# Patient Record
Sex: Male | Born: 2000 | Race: Black or African American | Hispanic: No | Marital: Single | Smoking: Never smoker
Health system: Southern US, Community
[De-identification: ages and names within clinical notes are randomized; demographics above are authoritative.]

## PROBLEM LIST (undated history)

## (undated) DIAGNOSIS — R55 Syncope and collapse: Secondary | ICD-10-CM

## (undated) DIAGNOSIS — I951 Orthostatic hypotension: Secondary | ICD-10-CM

## (undated) DIAGNOSIS — R002 Palpitations: Secondary | ICD-10-CM

## (undated) HISTORY — DX: Syncope and collapse: R55

## (undated) HISTORY — DX: Orthostatic hypotension: I95.1

## (undated) HISTORY — DX: Palpitations: R00.2

## (undated) HISTORY — PX: NO PAST SURGERIES: SHX2092

---
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
## 2018-01-08 DIAGNOSIS — Z8679 Personal history of other diseases of the circulatory system: Secondary | ICD-10-CM | POA: Insufficient documentation

## 2018-01-08 DIAGNOSIS — R002 Palpitations: Secondary | ICD-10-CM | POA: Insufficient documentation

## 2018-01-12 ENCOUNTER — Encounter: Payer: Self-pay | Admitting: Internal Medicine

## 2018-01-12 ENCOUNTER — Ambulatory Visit (INDEPENDENT_AMBULATORY_CARE_PROVIDER_SITE_OTHER): Payer: 59 | Admitting: Internal Medicine

## 2018-01-12 DIAGNOSIS — Z8679 Personal history of other diseases of the circulatory system: Secondary | ICD-10-CM

## 2018-01-12 DIAGNOSIS — R002 Palpitations: Secondary | ICD-10-CM

## 2018-01-12 NOTE — Patient Instructions (Signed)
Medication Instructions:  Your physician recommends that you continue on your current medications as directed. Please refer to the Current Medication list given to you today.  Labwork: None ordered.  Testing/Procedures: Zio Event Monitor [place] in our [place] location.  Follow-Up: Your physician recommends that you schedule a follow-up appointment in: 4 weeks after your patch is placed with Dr Graciela [facility]Klein [facility]   Any Other Special Instructions Will Be Listed Below (If Applicable).     If you need a refill on your cardiac medications before your next appointment, please call your pharmacy.

## 2018-01-12 NOTE — Progress Notes (Signed)
ELECTROPHYSIOLOGY OFFICE  NOTE  Patient ID: Vincent Cortez, MRN: [id number], DOB/AGE: 19/03/2001 16 y.o. Admit date: (Not on file) Date of Consult: 01/12/2018  Primary Physician: Vincent Cortez, [name], MD Primary Cardiologist: Vincent Cortez     Vincent Heide GuileLaurence Cortez is a 17 y.o. male who is being seen today for the evaluation of palpitations at his own request Self-referred because of tachypalpitations.  He has had these over the years.  I reviewed notes from 2015 from pediatric cardiology.  They were felt to be not significant.  A monitor was recommended and declined.  He is continued to have episodes.  They occur a couple times a week.  They are abrupt in onset and offset last 5-10 seconds.  An episode a few weeks ago occurred while he was urinating at night and was associated with syncope/near syncope.  He has a history of post exercise lightheadedness.  He is prone to dehydration particularly notable in the summer again always with post exertion.  He has had no exertional lightheadedness.  He has no exercise intolerance.    Past Medical History:  Diagnosis Date  . Orthostatic hypotension   . Palpitations   . Syncope    Surgical History:  Past Surgical History:  Procedure Laterality Date  . NO PAST SURGERIES       Home Meds: Prior to Admission medications   Not on File    Allergies: No Known Allergies  Social History   Socioeconomic History  . Marital status: Single    Spouse name: Not on file  . Number of children: Not on file  . Years of education: Not on file  . Highest education level: Not on file  Occupational History  . Not on file  Social Needs  . Financial resource strain: Not on file  . Food insecurity:    Worry: Not on file    Inability: Not on file  . Transportation needs:    Medical: Not on file    Non-medical: Not on file  Tobacco Use  . Smoking status: Never Smoker  . Smokeless tobacco: Never Used  Substance and Sexual Activity  . Alcohol  use: No  . Drug use: Never  . Sexual activity: Not on file  Lifestyle  . Physical activity:    Days per week: Not on file    Minutes per session: Not on file  . Stress: Not on file  Relationships  . Social connections:    Talks on phone: Not on file    Gets together: Not on file    Attends religious service: Not on file    Active member of club or organization: Not on file    Attends meetings of clubs or organizations: Not on file    Relationship status: Not on file  . Intimate partner violence:    Fear of current or ex partner: Not on file    Emotionally abused: Not on file    Physically abused: Not on file    Forced sexual activity: Not on file  Other Topics Concern  . Not on file  Social History Narrative  . Not on file     Family History  Problem Relation Age of Onset  . Cancer Paternal Grandmother   . Hypertension Paternal Grandfather      ROS:  Please see the history of present illness.     All other systems reviewed and negative.    Physical Exam: Blood pressure 120/76, pulse 70, height 6\' 3"  (1.905 m),  weight 203 lb (92.1 kg), SpO2 99 %. General: Well developed, well nourished male in no acute distress. Head: Normocephalic, atraumatic, sclera non-icteric, no xanthomas, nares are without discharge. EENT: normal  Lymph Nodes:  none Neck: Negative for carotid bruits. JVD not elevated. Back:without scoliosis kyphosis  Lungs: Clear bilaterally to auscultation without wheezes, rales, or rhonchi. Breathing is unlabored. Heart: RRR with S1 S2. N murmur . No rubs, or gallops appreciated. Abdomen: Soft, non-tender, non-distended with normoactive bowel sounds. No hepatomegaly. No rebound/guarding. No obvious abdominal masses. Msk:  Strength and tone appear normal for age. Extremities: No clubbing or cyanosis.  edema.  Distal pedal pulses are 2+ and equal bilaterally. Skin: Warm and Dry Neuro: Alert and oriented X 3. CN III-XII intact Grossly normal sensory and motor  function . Psych:  Responds to questions appropriately with a normal affect.      Labs: Cardiac Enzymes No results for input(s): CKTOTAL, CKMB, TROPONINI in the last 72 hours. CBC No results found for: WBC, HGB, HCT, MCV, PLT PROTIME: No results for input(s): LABPROT, INR in the last 72 hours. Chemistry No results for input(s): NA, K, CL, CO2, BUN, CREATININE, CALCIUM, PROT, BILITOT, ALKPHOS, ALT, AST, GLUCOSE in the last 168 hours.  Invalid input(s): LABALBU Lipids No results found for: CHOL, HDL, LDLCALC, TRIG BNP No results found for: PROBNP Thyroid Function Tests: No results for input(s): TSH, T4TOTAL, T3FREE, THYROIDAB in the last 72 hours.  Invalid input(s): FREET3 Miscellaneous No results found for: DDIMER  Radiology/Studies:  No results found.  EKG: Sinus rhythm at 60 Intervals 18/09/38 Axis 64   Assessment and Plan:  Palpitations abrupt in onset  Post exertional lightheadedness  Presyncope    We have reviewed the mechanisms of volume dependent lightheadedness and its relationship to post exertional lightheadedness.  He has no symptoms to suggest more malignant lightheadedness.  Stressed the importance of salt and water repletion.  His palpitations are interesting in that they are abrupt in onset but rather brief.  I am not sure whether they represent an atrial tachycardia or short bursts of reentrant tachycardia.  Will use a ZIO Patch.      Sherryl Manges

## 2018-01-14 ENCOUNTER — Ambulatory Visit: Payer: 59

## 2018-01-14 DIAGNOSIS — R002 Palpitations: Secondary | ICD-10-CM

## 2018-01-14 DIAGNOSIS — Z8679 Personal history of other diseases of the circulatory system: Secondary | ICD-10-CM

## 2018-01-15 ENCOUNTER — Ambulatory Visit: Payer: PRIVATE HEALTH INSURANCE | Admitting: Internal Medicine

## 2018-01-19 NOTE — Addendum Note (Signed)
Addended by: Dareen PianoKENAN, Samaiyah Howes C on: 01/19/2018 07:40 AM   Modules accepted: Orders

## 2018-01-23 ENCOUNTER — Telehealth: Payer: Self-pay

## 2018-01-23 NOTE — Telephone Encounter (Signed)
-----   Message from Stann MainlandJennifer O Clark, RN sent at 01/23/2018  8:21 AM EDT ----- Regarding: FW: 4 week f/u [facility] GoesHey Ladies,  Can you call patient to set up appointment with Dr Graciela HusbandsKlein as requested below?  Thanks, Victorino DikeJennifer  ----- Message ----- From: Rebbeca PaulKenan, Detavia, CMA Sent: 01/22/2018   8:51 AM To: Jefferey PicaHeather C McGhee, RN Subject: 4 week f/u                                     Hey :)  This patient was seen in [place] by SK and was ordered a ZIO patch that was placed on 6/5. SK wanted a f/u 4 weeks post monitor for him in [place]. Can you arrange for me please?  Thanks

## 2018-01-23 NOTE — Telephone Encounter (Signed)
Left msg for pt to call and schedule 4 wk f/u with Dr. Graciela HusbandsKlein

## 2018-01-29 NOTE — Telephone Encounter (Signed)
Lmov for patient to call and schedule °

## 2018-02-04 NOTE — Telephone Encounter (Signed)
Pt is scheduled 02/26/18

## 2018-02-26 ENCOUNTER — Ambulatory Visit: Payer: 59 | Admitting: Internal Medicine

## 2018-03-05 ENCOUNTER — Ambulatory Visit (INDEPENDENT_AMBULATORY_CARE_PROVIDER_SITE_OTHER): Payer: 59 | Admitting: Internal Medicine

## 2018-03-05 ENCOUNTER — Encounter: Payer: Self-pay | Admitting: Internal Medicine

## 2018-03-05 VITALS — BP 124/82 | HR 64 | Ht 75.0 in | Wt 197.0 lb

## 2018-03-05 DIAGNOSIS — R002 Palpitations: Secondary | ICD-10-CM | POA: Diagnosis not present

## 2018-03-05 NOTE — Patient Instructions (Signed)
Medication Instructions: - no changes  Labwork: - none ordered  Procedures/Testing: - none ordered  Follow-Up: - as needed with Dr. Graciela HusbandsKlein.  Any Additional Special Instructions Will Be Listed Below (If Applicable).     If you need a refill on your cardiac medications before your next appointment, please call your pharmacy.

## 2018-03-05 NOTE — Progress Notes (Signed)
ELECTROPHYSIOLOGY OFFICE  NOTE  Patient ID: Vincent Cortez, MRN: [id number], DOB/AGE: 2001/04/30 16 y.o. Admit date: (Not on file) Date of Consult: 03/05/2018  Primary Physician: Herb Grays, MD Primary Cardiologist: Vincent Cortez is a 17 y.o. male seen in followup for Self-referral because of tachypalpitations.  He has had these over the years.  I reviewed notes from 2015 from pediatric cardiology.  They were felt to be not significant.  A monitor was recommended and declined.  An episode a few weeks ago occurred while he was urinating at night and was associated with syncope/near syncope.  A ZIIO patch was worn; it lasted 4 days.  Multiple events were identified.  They were typical.  Frequently nocturnal.    Past Medical History:  Diagnosis Date  . Orthostatic hypotension   . Palpitations   . Syncope    Surgical History:  Past Surgical History:  Procedure Laterality Date  . NO PAST SURGERIES       Home Meds: Prior to Admission medications   Not on File    Allergies: No Known Allergies  Social History   Socioeconomic History  . Marital status: Single    Spouse name: Not on file  . Number of children: Not on file  . Years of education: Not on file  . Highest education level: Not on file  Occupational History  . Not on file  Social Needs  . Financial resource strain: Not on file  . Food insecurity:    Worry: Not on file    Inability: Not on file  . Transportation needs:    Medical: Not on file    Non-medical: Not on file  Tobacco Use  . Smoking status: Never Smoker  . Smokeless tobacco: Never Used  Substance and Sexual Activity  . Alcohol use: No  . Drug use: Never  . Sexual activity: Not on file  Lifestyle  . Physical activity:    Days per week: Not on file    Minutes per session: Not on file  . Stress: Not on file  Relationships  . Social connections:    Talks on phone: Not on file    Gets together: Not on  file    Attends religious service: Not on file    Active member of club or organization: Not on file    Attends meetings of clubs or organizations: Not on file    Relationship status: Not on file  . Intimate partner violence:    Fear of current or ex partner: Not on file    Emotionally abused: Not on file    Physically abused: Not on file    Forced sexual activity: Not on file  Other Topics Concern  . Not on file  Social History Narrative  . Not on file     Family History  Problem Relation Age of Onset  . Cancer Paternal Grandmother   . Hypertension Paternal Grandfather      ROS:  Please see the history of present illness.     All other systems reviewed and negative.    Physical Exam: Blood pressure 124/82, pulse 64, height 6\' 3"  (1.905 m), weight 197 lb (89.4 kg). Well developed and nourished in no acute distress HENT normal Neck supple  t Clear Regular rate and rhythm, no murmurs or gallops Abd-soft   No Clubbing cyanosis edema Skin-warm and dry A & Oriented  Grossly normal sensory and motor function  Labs: Cardiac Enzymes No results for input(s): CKTOTAL, CKMB, TROPONINI in the last 72 hours. CBC No results found for: WBC, HGB, HCT, MCV, PLT PROTIME: No results for input(s): LABPROT, INR in the last 72 hours. Chemistry No results for input(s): NA, K, CL, CO2, BUN, CREATININE, CALCIUM, PROT, BILITOT, ALKPHOS, ALT, AST, GLUCOSE in the last 168 hours.  Invalid input(s): LABALBU Lipids No results found for: CHOL, HDL, LDLCALC, TRIG BNP No results found for: PROBNP Thyroid Function Tests: No results for input(s): TSH, T4TOTAL, T3FREE, THYROIDAB in the last 72 hours.  Invalid input(s): FREET3 Miscellaneous No results found for: DDIMER  Radiology/Studies:  No results found.  ZIO patch was evaluated with the patient.  He had nocturnal Mobitz 1 heart block.  He had symptoms associated with isolated ectopic beats and symptoms associated with a transient  bradycardia mechanism not clear.  Assessment and Plan:  Palpitations abrupt in onset  Post exertional lightheadedness  Presyncope  We again have reviewed the importance of volume resuscitation.  We reviewed also the data from his CO monitor.  No significant arrhythmias were identified.    Sherryl MangesSteven Anquan Azzarello

## 2019-09-16 ENCOUNTER — Ambulatory Visit
Admission: EM | Admit: 2019-09-16 | Discharge: 2019-09-16 | Disposition: A | Discharge status: Home / Self Care | Payer: BLUE CROSS/BLUE SHIELD | Attending: Family Medicine | Admitting: Family Medicine

## 2019-09-16 ENCOUNTER — Other Ambulatory Visit: Payer: Self-pay

## 2019-09-16 DIAGNOSIS — R21 Rash and other nonspecific skin eruption: Secondary | ICD-10-CM

## 2019-09-16 MED ORDER — TERBINAFINE HCL 250 MG PO TABS: 250.0000 mg | ORAL_TABLET | Freq: Every day | ORAL | 0 refills | Status: AC

## 2019-09-16 NOTE — ED Triage Notes (Signed)
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 States that he noticed it a few days ago with mild itching.

## 2019-09-16 NOTE — Discharge Instructions (Signed)
Medication as prescribed.  See Dermatology it persists (Call [facility] for an appt).  Take care  Dr. Adriana Simas

## 2019-09-16 NOTE — ED Provider Notes (Signed)
[facility]    CSN: [id number] Arrival date & time: 09/16/19  0900      History   Chief Complaint Chief Complaint  Patient presents with  . Tinea   HPI   19 year old male presents with rash.  Patient reports a 1 week history of rash.  He reports that the rash is located in his beard, and his scalp, and he has also noted it on his upper eyelids.  Mild itching.  He is concerned that this is ringworm.  No known inciting event.  No new changes or exposures.  No exacerbating relieving factors.  No other associated symptoms.  No other complaints.  Past Medical History:  Diagnosis Date  . Orthostatic hypotension   . Palpitations   . Syncope    Patient Active Problem List   Diagnosis Date Noted  . Hx of sinus tachycardia 01/08/2018  . Heart palpitations 01/08/2018   Past Surgical History:  Procedure Laterality Date  . NO PAST SURGERIES     Home Medications    Prior to Admission medications   Medication Sig Start Date End Date Taking? Authorizing Provider  terbinafine (LAMISIL) 250 MG tablet Take 1 tablet (250 mg total) by mouth daily. 09/16/19   Coral Spikes, DO    Family History Family History  Problem Relation Age of Onset  . Cancer Paternal Grandmother   . Hypertension Paternal Grandfather     Social History Social History   Tobacco Use  . Smoking status: Never Smoker  . Smokeless tobacco: Never Used  Substance Use Topics  . Alcohol use: No  . Drug use: Never     Allergies   Patient has no known allergies.   Review of Systems Review of Systems  Constitutional: Negative.   Skin: Positive for rash.   Physical Exam Triage Vital Signs ED Triage Vitals  Enc Vitals Group     BP 09/16/19 0931 138/89     Pulse Rate 09/16/19 0931 71     Resp 09/16/19 0931 16     Temp 09/16/19 0931 98.7 F (37.1 C)     Temp Source 09/16/19 0931 Oral     SpO2 09/16/19 0931 98 %     Weight 09/16/19 0929 185 lb (83.9 kg)     Height 09/16/19 0929 6\' 3"  (1.905  m)     Head Circumference --      Peak Flow --      Pain Score 09/16/19 0929 0     Pain Loc --      Pain Edu? --      Excl. in [place]? --    Updated Vital Signs BP 138/89 (BP Location: Left Arm)   Pulse 71   Temp 98.7 F (37.1 C) (Oral)   Resp 16   Ht 6\' 3"  (1.905 m)   Wt 83.9 kg   SpO2 98%   BMI 23.12 kg/m   Visual Acuity Right Eye Distance:   Left Eye Distance:   Bilateral Distance:    Right Eye Near:   Left Eye Near:    Bilateral Near:     Physical Exam Constitutional:      General: He is not in acute distress.    Appearance: Normal appearance. He is not ill-appearing.  HENT:     Head: Normocephalic and atraumatic.  Eyes:     General:        Right eye: No discharge.        Left eye: No discharge.  Conjunctiva/sclera: Conjunctivae normal.  Pulmonary:     Effort: Pulmonary effort is normal. No respiratory distress.  Skin:    Comments: Erythematous, mildly scaly rash noted on the upper eyelids, beard, and posterior scalp.  Neurological:     Mental Status: He is alert.  Psychiatric:        Mood and Affect: Mood normal.        Behavior: Behavior normal.    UC Treatments / Results  Labs (all labs ordered are listed, but only abnormal results are displayed) Labs Reviewed - No data to display  EKG   Radiology No results found.  Procedures Procedures (including critical care time)  Medications Ordered in UC Medications - No data to display  Initial Impression / Assessment and Plan / UC Course  I have reviewed the triage vital signs and the nursing notes.  Pertinent labs & imaging results that were available during my care of the patient were reviewed by me and considered in my medical decision making (see chart for details).    19 year old male presents with rash.  Questionable tinea.  Placing on empiric terbinafine.  Advised to see dermatology if persists or worsens.  Final Clinical Impressions(s) / UC Diagnoses   Final diagnoses:  Rash      Discharge Instructions     Medication as prescribed.  See Dermatology it persists (Call [facility] for an appt).  Take care  Dr. Adriana Simas    ED Prescriptions    Medication Sig Dispense Auth. Provider   terbinafine (LAMISIL) 250 MG tablet Take 1 tablet (250 mg total) by mouth daily. 30 tablet Tommie Sams, DO     PDMP not reviewed this encounter.   Everlene Other [place], [place] 09/16/19 [phone]

## 2019-12-16 ENCOUNTER — Other Ambulatory Visit: Payer: Self-pay

## 2019-12-16 ENCOUNTER — Ambulatory Visit
Admission: EM | Admit: 2019-12-16 | Discharge: 2019-12-16 | Disposition: A | Discharge status: Home / Self Care | Payer: BLUE CROSS/BLUE SHIELD | Attending: Family Medicine | Admitting: Family Medicine

## 2019-12-16 ENCOUNTER — Ambulatory Visit (INDEPENDENT_AMBULATORY_CARE_PROVIDER_SITE_OTHER): Payer: BLUE CROSS/BLUE SHIELD

## 2019-12-16 ENCOUNTER — Encounter: Payer: Self-pay | Admitting: Emergency Medicine

## 2019-12-16 DIAGNOSIS — S62637A Displaced fracture of distal phalanx of left little finger, initial encounter for closed fracture: Secondary | ICD-10-CM | POA: Diagnosis not present

## 2019-12-16 NOTE — ED Provider Notes (Signed)
[facility] ____________________________________________  Time seen: Approximately 12:40 PM  I have reviewed the triage vital signs and the nursing notes.   HISTORY  Chief Complaint Finger Injury (left pinky)   HPI Vincent Cortez is a 19 y.o. male presented for evaluation of left pinky finger pain after injury that occurred last night.  Patient was playing basketball and the ball and another player hit his pinky causing pain.  Has continued remain active but has pain and decreased range of motion to his pinky.  Reports otherwise doing well.  Denies other complaints.  Denies other injuries.  Applied ice.  Denies other aggravating alleviating factors.   Past Medical History:  Diagnosis Date  . Orthostatic hypotension   . Palpitations   . Syncope     Patient Active Problem List   Diagnosis Date Noted  . Hx of sinus tachycardia 01/08/2018  . Heart palpitations 01/08/2018    Past Surgical History:  Procedure Laterality Date  . NO PAST SURGERIES       No current facility-administered medications for this encounter.  Current Outpatient Medications:  .  terbinafine (LAMISIL) 250 MG tablet, Take 1 tablet (250 mg total) by mouth daily., Disp: 30 tablet, Rfl: 0  Allergies Patient has no known allergies.  Family History  Problem Relation Age of Onset  . Cancer Paternal Grandmother   . Hypertension Paternal Grandfather     Social History Social History   Tobacco Use  . Smoking status: Never Smoker  . Smokeless tobacco: Never Used  Substance Use Topics  . Alcohol use: No  . Drug use: Never    Review of Systems Constitutional: No fever ENT: No sore throat. Cardiovascular: Denies chest pain. Respiratory: Denies shortness of breath. Musculoskeletal: Positive left finger pain. Skin: Negative for rash.  ____________________________________________   PHYSICAL EXAM:  VITAL SIGNS: ED Triage Vitals  Enc Vitals Group     BP 12/16/19 1019  131/71     Pulse Rate 12/16/19 1019 74     Resp 12/16/19 1019 18     Temp 12/16/19 1019 98.3 F (36.8 C)     Temp Source 12/16/19 1019 Oral     SpO2 12/16/19 1019 100 %     Weight 12/16/19 1016 184 lb 15.5 oz (83.9 kg)     Height 12/16/19 1016 6\' 3"  (1.905 m)     Head Circumference --      Peak Flow --      Pain Score 12/16/19 1016 2     Pain Loc --      Pain Edu? --      Excl. in [place]? --     Constitutional: Alert and oriented. Well appearing and in no acute distress. Eyes: Conjunctivae are normal.  ENT      Head: Normocephalic and atraumatic. Cardiovascular:  Good peripheral circulation. Respiratory: Normal respiratory effort without tachypnea nor retractions.  Musculoskeletal: Steady gait. Except: Left fifth digit proximal phalanx to the middle phalanx pain with mild swelling, mild decreased flexion at PIP joint, good distal resisted flexion and extension with normal distal sensation and capillary refill.  Left hand otherwise nontender. Neurologic:  Normal speech and language.  Speech is normal. No gait instability.  Skin:  Skin is warm, dry and intact. No rash noted. Psychiatric: Mood and affect are normal. Speech and behavior are normal. Patient exhibits appropriate insight and judgment   ___________________________________________   LABS (all labs ordered are listed, but only abnormal results are displayed)   RADIOLOGY  DG Finger  Little Left  Result Date: 12/16/2019 CLINICAL DATA:  Injured LEFT little finger playing basketball, jammed then got hit by another player, with pain, deformity, and swelling EXAM: LEFT LITTLE FINGER 2+V COMPARISON:  None FINDINGS: Osseous mineralization normal. Joint spaces preserved. Mildly displaced long oblique fracture of through the distal aspect of proximal phalanx LEFT little finger. Fracture plane extends to very near the radial margin of the PIP joint without definite intra-articular extension. No additional fracture, dislocation, or bone  destruction. IMPRESSION: Displaced oblique fracture through distal aspect of proximal phalanx LEFT little finger as above. Electronically Signed   By: Ulyses Southward M.D.   On: 12/16/2019 11:07   ____________________________________________   PROCEDURES Procedures    INITIAL IMPRESSION / ASSESSMENT AND PLAN / ED COURSE  Pertinent labs & imaging results that were available during my care of the patient were reviewed by me and considered in my medical decision making (see chart for details).  Well-appearing patient.  No acute distress.  Left pinky finger pain post mechanical injury.  Left knee finger x-ray as above, displaced oblique fracture through distal aspect of the proximal phalanx left finger.  Gutter splint applied with fourth and fifth fingers applied.  Encouraged to elevate, ice and keep in splint.  Follow-up with orthopedic this coming week.  Supportive care Tylenol, ibuprofen and ice.  Discussed follow up with Primary care physician this week. Discussed follow up and return parameters including no resolution or any worsening concerns. Patient verbalized understanding and agreed to plan.   ____________________________________________   FINAL CLINICAL IMPRESSION(S) / ED DIAGNOSES  Final diagnoses:  Closed displaced fracture of distal phalanx of left little finger, initial encounter     ED Discharge Orders    None       Note: This dictation was prepared with Dragon dictation along with smaller phrase technology. Any transcriptional errors that result from this process are unintentional.         Renford Dills, NP 12/16/19 1242

## 2019-12-16 NOTE — Discharge Instructions (Addendum)
Keep in splint.  Ice.  Elevate.  Over-the-counter Tylenol ibuprofen as needed.  Follow-up with orthopedic this coming week as discussed.  See above to call today to schedule.  Follow up with your primary care physician this week as needed. Return to Urgent care for new or worsening concerns.

## 2019-12-16 NOTE — ED Triage Notes (Signed)
Pt c/o left pinky pain and swelling. Pt was playing basketball and thinks the ball hit it and kept playing.

## 2021-03-13 IMAGING — CR DG FINGER LITTLE 2+V*L*
3 series · 3 of 3 positions shown · non-contrast
Comparison: None

CLINICAL DATA: Injured LEFT little finger playing basketball,
jammed then got hit by another player, with pain, deformity, and
swelling

EXAM:
LEFT LITTLE FINGER 2+V

[finger ap]
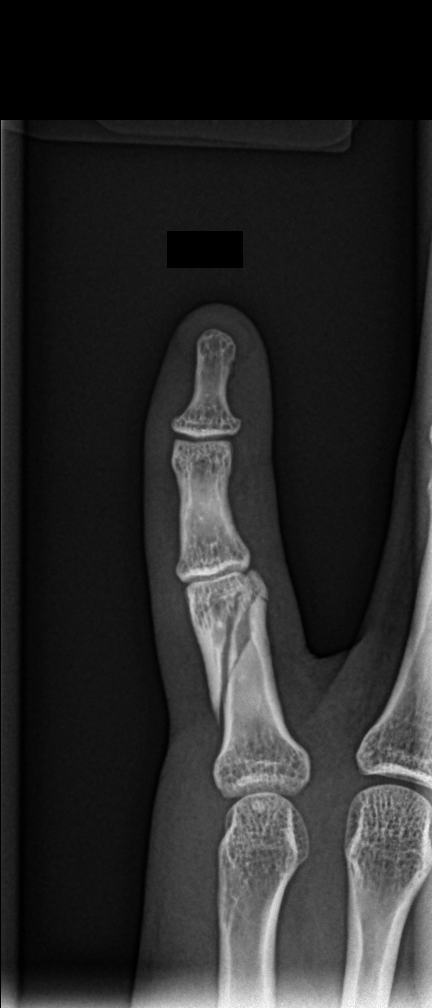

[finger obl]
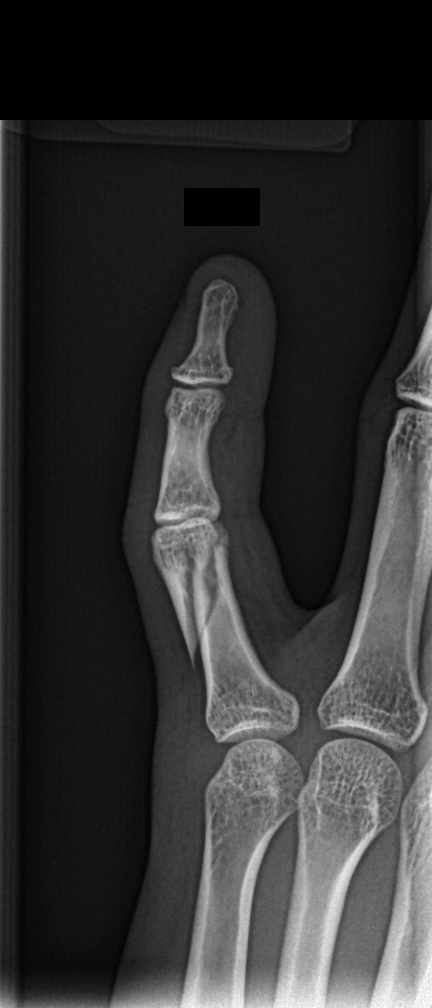

[finger lat]
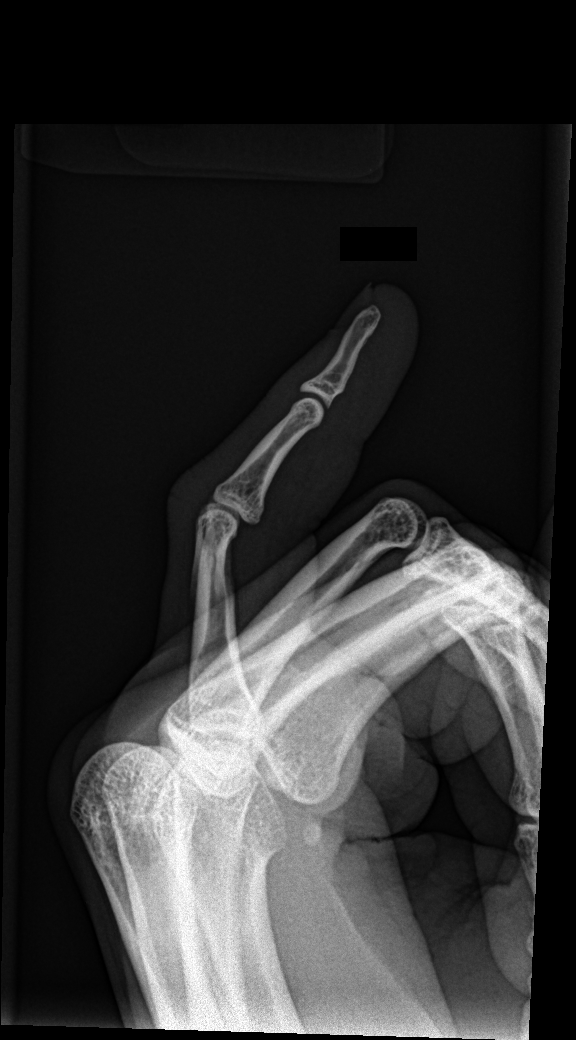

[3 of 3 positions shown; findings below may reference images not displayed]

FINDINGS: Osseous mineralization normal.

Joint spaces preserved.

Mildly displaced long oblique fracture of through the distal aspect
of proximal phalanx LEFT little finger.

Fracture plane extends to very near the radial margin of the PIP
joint without definite intra-articular extension.

No additional fracture, dislocation, or bone destruction.
IMPRESSION: Displaced oblique fracture through distal aspect of proximal phalanx
LEFT little finger as above.
